# Patient Record
Sex: Female | Born: 1965 | Race: White | Hispanic: No | Marital: Single
Health system: Southern US, Community
[De-identification: ages and names within clinical notes are randomized; demographics above are authoritative.]

---

## 2019-06-29 ENCOUNTER — Other Ambulatory Visit: Payer: Self-pay

## 2019-06-29 ENCOUNTER — Emergency Department (HOSPITAL_BASED_OUTPATIENT_CLINIC_OR_DEPARTMENT_OTHER)
Admission: EM | Admit: 2019-06-29 | Discharge: 2019-06-29 | Disposition: A | Discharge status: Home / Self Care | Payer: Worker's Compensation | Attending: Emergency Medicine | Admitting: Emergency Medicine

## 2019-06-29 ENCOUNTER — Encounter (HOSPITAL_BASED_OUTPATIENT_CLINIC_OR_DEPARTMENT_OTHER): Payer: Self-pay | Admitting: Adult Health

## 2019-06-29 ENCOUNTER — Emergency Department (HOSPITAL_BASED_OUTPATIENT_CLINIC_OR_DEPARTMENT_OTHER): Payer: Worker's Compensation

## 2019-06-29 DIAGNOSIS — Y998 Other external cause status: Secondary | ICD-10-CM | POA: Insufficient documentation

## 2019-06-29 DIAGNOSIS — Y9389 Activity, other specified: Secondary | ICD-10-CM | POA: Diagnosis not present

## 2019-06-29 DIAGNOSIS — Y92410 Unspecified street and highway as the place of occurrence of the external cause: Secondary | ICD-10-CM | POA: Insufficient documentation

## 2019-06-29 DIAGNOSIS — S3992XA Unspecified injury of lower back, initial encounter: Secondary | ICD-10-CM | POA: Diagnosis present

## 2019-06-29 DIAGNOSIS — S39012A Strain of muscle, fascia and tendon of lower back, initial encounter: Secondary | ICD-10-CM | POA: Insufficient documentation

## 2019-06-29 LAB — BASIC METABOLIC PANEL
Anion gap: 10 (ref 5–15)
BUN: 17 mg/dL (ref 6–20)
CO2: 24 mmol/L (ref 22–32)
Calcium: 9.3 mg/dL (ref 8.9–10.3)
Chloride: 106 mmol/L (ref 98–111)
Creatinine, Ser: 0.72 mg/dL (ref 0.44–1.00)
GFR calc Af Amer: >60 mL/min (ref >60)
GFR calc non Af Amer: >60 mL/min (ref >60)
Glucose, Bld: 113 mg/dL — ABNORMAL HIGH (ref 70–99)
Potassium: 3.6 mmol/L (ref 3.5–5.1)
Sodium: 140 mmol/L (ref 135–145)

## 2019-06-29 LAB — CBC WITH DIFFERENTIAL/PLATELET
Abs Immature Granulocytes: 0.02 10*3/uL (ref 0.00–0.07)
Basophils Absolute: 0 10*3/uL (ref 0.0–0.1)
Basophils Relative: 1 %
Eosinophils Absolute: 0.1 10*3/uL (ref 0.0–0.5)
Eosinophils Relative: 2 %
HCT: 41.6 % (ref 36.0–46.0)
Hemoglobin: 13.4 g/dL (ref 12.0–15.0)
Immature Granulocytes: 0 %
Lymphocytes Relative: 29 %
Lymphs Abs: 2.4 10*3/uL (ref 0.7–4.0)
MCH: 29.3 pg (ref 26.0–34.0)
MCHC: 32.2 g/dL (ref 30.0–36.0)
MCV: 90.8 fL (ref 80.0–100.0)
Monocytes Absolute: 0.6 10*3/uL (ref 0.1–1.0)
Monocytes Relative: 7 %
Neutro Abs: 5.1 10*3/uL (ref 1.7–7.7)
Neutrophils Relative %: 61 %
Platelets: 263 10*3/uL (ref 150–400)
RBC: 4.58 MIL/uL (ref 3.87–5.11)
RDW: 12.9 % (ref 11.5–15.5)
WBC: 8.2 10*3/uL (ref 4.0–10.5)
nRBC: 0 % (ref 0.0–0.2)

## 2019-06-29 MED ORDER — IBUPROFEN 800 MG PO TABS: 800.0000 mg | ORAL_TABLET | Freq: Three times a day (TID) | ORAL | 0 refills | Status: AC

## 2019-06-29 MED ORDER — CYCLOBENZAPRINE HCL 5 MG PO TABS: 5.0000 mg | ORAL_TABLET | Freq: Three times a day (TID) | ORAL | 0 refills | Status: AC | PRN

## 2019-06-29 MED ORDER — IBUPROFEN 400 MG PO TABS
600.0000 mg | ORAL_TABLET | Freq: Once | ORAL | Status: AC
  Administered 2019-06-29: 600 mg via ORAL
  Filled 2019-06-29: qty 1
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
## 2019-06-29 NOTE — Discharge Instructions (Addendum)
Take motrin for pain   Take flexeril for muscle spasms   See your doctor   Expect to be stiff and sore for several days   Return to ER if you have worse back pain, numbness, weakness, neck pain

## 2019-06-30 ENCOUNTER — Emergency Department (HOSPITAL_BASED_OUTPATIENT_CLINIC_OR_DEPARTMENT_OTHER): Payer: Worker's Compensation

## 2019-06-30 ENCOUNTER — Emergency Department (HOSPITAL_BASED_OUTPATIENT_CLINIC_OR_DEPARTMENT_OTHER)
Admission: EM | Admit: 2019-06-30 | Discharge: 2019-06-30 | Disposition: A | Discharge status: Home / Self Care | Payer: Worker's Compensation | Attending: Emergency Medicine | Admitting: Emergency Medicine

## 2019-06-30 ENCOUNTER — Encounter (HOSPITAL_BASED_OUTPATIENT_CLINIC_OR_DEPARTMENT_OTHER): Payer: Self-pay | Admitting: *Deleted

## 2019-06-30 ENCOUNTER — Other Ambulatory Visit: Payer: Self-pay

## 2019-06-30 DIAGNOSIS — R410 Disorientation, unspecified: Secondary | ICD-10-CM | POA: Diagnosis not present

## 2019-06-30 DIAGNOSIS — Y92410 Unspecified street and highway as the place of occurrence of the external cause: Secondary | ICD-10-CM | POA: Diagnosis not present

## 2019-06-30 DIAGNOSIS — Y9389 Activity, other specified: Secondary | ICD-10-CM | POA: Insufficient documentation

## 2019-06-30 DIAGNOSIS — R4182 Altered mental status, unspecified: Secondary | ICD-10-CM | POA: Diagnosis present

## 2019-06-30 DIAGNOSIS — S060X0A Concussion without loss of consciousness, initial encounter: Secondary | ICD-10-CM | POA: Diagnosis not present

## 2019-06-30 DIAGNOSIS — Y999 Unspecified external cause status: Secondary | ICD-10-CM | POA: Insufficient documentation
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
## 2019-06-30 NOTE — ED Notes (Signed)
Patient transported to CT 
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
## 2019-07-02 ENCOUNTER — Telehealth: Payer: Self-pay
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
## 2019-07-03 ENCOUNTER — Telehealth: Payer: Self-pay | Admitting: *Deleted

## 2019-07-03 ENCOUNTER — Telehealth: Payer: Self-pay

## 2019-07-03 NOTE — Telephone Encounter (Signed)
Spoke with patient returning her call earlier and her lawyer's call. Asked if patient would like to schedule in concussion clinic. Patient said if she has to be self pay then she declines an appointment at this time. I said that she would be considered self pay and when I tried to explain reimbursement through her lawyer patient hung up on me for second day in a row.
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
